# Patient Record
Sex: Female | Born: 1965 | Race: White | Hispanic: No | Smoking: Current every day smoker
Health system: Southern US, Community
[De-identification: ages and names within clinical notes are randomized; demographics above are authoritative.]

## PROBLEM LIST (undated history)

## (undated) DIAGNOSIS — F909 Attention-deficit hyperactivity disorder, unspecified type: Secondary | ICD-10-CM

## (undated) DIAGNOSIS — F39 Unspecified mood [affective] disorder: Secondary | ICD-10-CM

## (undated) DIAGNOSIS — F17210 Nicotine dependence, cigarettes, uncomplicated: Secondary | ICD-10-CM

## (undated) DIAGNOSIS — N951 Menopausal and female climacteric states: Secondary | ICD-10-CM

## (undated) HISTORY — DX: Unspecified mood (affective) disorder: F39

## (undated) HISTORY — DX: Attention-deficit hyperactivity disorder, unspecified type: F90.9

## (undated) HISTORY — DX: Menopausal and female climacteric states: N95.1

## (undated) HISTORY — DX: Nicotine dependence, cigarettes, uncomplicated: F17.210

---

## 1998-06-25 ENCOUNTER — Other Ambulatory Visit: Admission: RE | Admit: 1998-06-25 | Discharge: 1998-06-25 | Payer: Self-pay | Admitting: Obstetrics & Gynecology

## 1998-09-10 ENCOUNTER — Other Ambulatory Visit: Admission: RE | Admit: 1998-09-10 | Discharge: 1998-09-10 | Payer: Self-pay | Admitting: Obstetrics & Gynecology

## 1998-12-27 ENCOUNTER — Other Ambulatory Visit: Admission: RE | Admit: 1998-12-27 | Discharge: 1998-12-27 | Payer: Self-pay | Admitting: Obstetrics & Gynecology

## 1999-03-09 ENCOUNTER — Emergency Department (HOSPITAL_COMMUNITY): Admission: EM | Admit: 1999-03-09 | Discharge: 1999-03-10 | Payer: Self-pay | Admitting: Emergency Medicine

## 1999-04-03 ENCOUNTER — Emergency Department (HOSPITAL_COMMUNITY): Admission: EM | Admit: 1999-04-03 | Discharge: 1999-04-04 | Payer: Self-pay | Admitting: Internal Medicine

## 1999-06-07 ENCOUNTER — Other Ambulatory Visit: Admission: RE | Admit: 1999-06-07 | Discharge: 1999-06-07 | Payer: Self-pay | Admitting: Obstetrics & Gynecology

## 1999-06-11 ENCOUNTER — Inpatient Hospital Stay (HOSPITAL_COMMUNITY): Admission: AD | Admit: 1999-06-11 | Discharge: 1999-06-11 | Payer: Self-pay | Admitting: Obstetrics & Gynecology

## 1999-12-02 ENCOUNTER — Ambulatory Visit (HOSPITAL_COMMUNITY): Admission: RE | Admit: 1999-12-02 | Discharge: 1999-12-02 | Payer: Self-pay | Admitting: Obstetrics & Gynecology

## 2002-01-06 ENCOUNTER — Encounter: Payer: Self-pay | Admitting: Family Medicine

## 2002-01-06 ENCOUNTER — Ambulatory Visit (HOSPITAL_COMMUNITY): Admission: RE | Admit: 2002-01-06 | Discharge: 2002-01-06 | Payer: Self-pay | Admitting: Family Medicine

## 2005-10-30 ENCOUNTER — Encounter: Admission: RE | Admit: 2005-10-30 | Discharge: 2005-10-30 | Payer: Self-pay | Admitting: Specialist

## 2005-12-07 ENCOUNTER — Ambulatory Visit (HOSPITAL_COMMUNITY): Admission: RE | Admit: 2005-12-07 | Discharge: 2005-12-08 | Payer: Self-pay | Admitting: Specialist

## 2009-03-22 ENCOUNTER — Emergency Department (HOSPITAL_BASED_OUTPATIENT_CLINIC_OR_DEPARTMENT_OTHER): Admission: EM | Admit: 2009-03-22 | Discharge: 2009-03-22 | Payer: Self-pay | Admitting: Emergency Medicine

## 2009-03-22 ENCOUNTER — Ambulatory Visit: Payer: Self-pay | Admitting: Radiology

## 2010-08-14 ENCOUNTER — Encounter: Payer: Self-pay | Admitting: Specialist

## 2010-12-09 NOTE — Op Note (Signed)
[facility]  Patient:    Monica Ponce, Monica Ponce                        MRN: [id number] Proc. Date: 12/02/99 Adm. Date:  [id number] Disc. Date: [id number] Attending:  Lars Pinks                           Operative Report  PREOPERATIVE DIAGNOSIS:       Voluntary sterilization.  POSTOPERATIVE DIAGNOSIS:      Voluntary sterilization.  OPERATION:                    Laparoscopic bilateral tubal cautery for sterilization.  SURGEON:                      Richard D. Arlyce Dice, M.D.  ASSISTANT:  ANESTHESIA:                   General endotracheal anesthesia.  ESTIMATED BLOOD LOSS:         10 cc.  FINDINGS:                     Normal appearing tubes, ovaries, and uterus.  No evidence of endometriosis or adhesions.  INDICATIONS:                  This is a 45 year old, gravida 1, para 1, who requested permanent sterilization.  Although, the patient has only one child, she has two step children in her present marriage and desires no further pregnancies. The risk of failure of 2 to 5 per 1000, the fact that the procedure was permanent, and alternative birth control methods which were reversible were all discussed ith her prior to proceeding.  DESCRIPTION OF PROCEDURE:     The patient was taken to the operating room and placed in the supine position and general endotracheal anesthesia was induced. She was then placed in the dorsal lithotomy position and the abdomen, vagina, and perineum were prepped and draped in a sterile fashion.  The bladder was catheterized.  A Hulka tenaculum was placed through the endocervical canal and affixed to the anterior lip of the cervix.  The cervix and uterus were elevated and the Veress needle was introduced into the cul-de-sac.  A pneumoperitoneum was created.  The surgeon regowned and regloved.  An incision was made at the base f the umbilicus.  The laparoscopic trocar was introduced into the pneumoperitoneum. Under  direct visualization an accessory instrument was placed through the suprapubic stab wound.  The pelvis was viewed with the findings noted above. The left fallopian tube was grasped at the isthmic ampullary junction and cauterized along a 3 to 4 cm length leaving 1 cm of normal appearing tube proximal to the urn site.  An identical procedure was then carried out on the contralateral tube. he procedure was then terminated.  The gas was allowed to escape.  The umbilical incision was closed with a subcuticular 4-0 Dexon suture and the suprapubic incision was closed with Steri-Strips.  The patient tolerated the procedure well and left the operating room in good condition. DD:  12/05/99 TD:  12/05/99 Job: [id number]

## 2010-12-09 NOTE — Op Note (Signed)
NAME:  Monica Ponce, Monica Ponce NO.:  [id number]   MEDICAL RECORD NO.:  [id number]          PATIENT TYPE:  AMB   LOCATION:  DAY                          FACILITY:  [facility]   PHYSICIAN:  Jene Every, M.D.    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   OPERATION/PROCEDURE:  1.  Lateral recess decompression.  2.  Foraminotomy L5-S1  3.  Hemilaminotomy L5-S1.   SURGEON:  Jene Every, M.D.   ANESTHESIA:  General.   ASSISTANT:  Roma Schanz, P.A.-C.  with use of operating microscope.   BRIEF HISTORY AND INDICATIONS:  This is a 45 year old female with L5-S1  radiculopathy found to be secondary to lateral recess stenosis and foraminal  disk protrusion only diagnosed by CT myelogram.  MRI showed disk  degeneration which was fairly significant on May 1, but she had minimal back  pain, predominantly buttock and leg pain at the S1 nerve redistribution.  Myelogram indicated significant lateral recess stenosis. Operative  intervention was indicated for decompressing the S1 nerve root by lateral  recess decompression, foraminotomy of L5, evaluation of the disk extending  into the foramen.  The risks and benefits were discussed including bleeding,  infection, anesthetic risks, CSF leakage, epidural fibrosis, __________  disease, need for fusion in the future, anesthetic complications, etc.   DESCRIPTION OF PROCEDURE:  With the patient in the supine position and after  induction of adequate general anesthesia and 1 g of Kefzol, she was placed  prone on the [place] frame.  All bony prominences were well padded.  The  lumbar region was prepped and draped in the usual sterile fashion.  An Tuohy  18-gauge spinal  needle was utilized to localize the L5-S1 interspace  confirmed with x-ray.  Incision was made from the spinous process of L5 to  S1.  Subcutaneous tissue was dissected using electrocautery to achieve  hemostasis.  Dorsal lumbar fascia was identified and divided in  line with  the skin incision.  Paraspinous muscle elevated from the lamina of L5-S1.  McCullough retractors were placed and operating microscope was draped and  brought into the surgical field.  After an x-ray was obtained with Penfield  4 in the interlaminar space confirming the x-ray.  Hemilaminotomy the caudad  edge L5 was performed with  a 2 mm Kerrison, detaching a ligamentum flavum  from the cephalad edge it was also detached from the cephalad edge of S1 and  a foraminotomy of S1 was performed with a 2 mm and 3 mm Kerrison.  Ligamentum flavum removed from the interspace.  There was severe lateral  recessed stenosis noted in comparison with the S1 nerve root secondary to  the facet ligamentum flavum hypertrophy.  This was decompressed out to the  medial border of the pedicle with a 2 mm Kerrison.  This decompressed the  S1  nerve root which was erythematous and edematous.  Then more proximally  performed a foraminotomy of L5.  It was stenotic.  Evaluated the disk.  It  was a hard disk at L5-S1, not a soft disk.  A foraminotomy was completed and  hockey stick probe then placed freely on the L5 foramen.  There was good  excursion of the L5-S1 nerve root, at least 1 cm at the mid pedicle without  difficulty.  I felt this adequately decompressed the L5-S1 nerve roots.  Wound was copiously irrigated with antibiotic irrigation.  Inspection  revealed no CSF leak or active bleeding.  Thrombin-soaked Gelfoam was placed  in the laminotomy defect.  McCullough retractor was removed.  Paraspinous  muscles inspected and there was no active bleeding.  Dorsal lumbar fascia  was reapproximated with #1 Vicryl interrupted figure-of-eight  sutures,  subcutaneous tissue reapproximated with 2-0 Vicryl interrupted sutures.  Skin reapproximated with 4-0 subcuticular Prolene.  Wound reinforced with  Steri-Strips, sterile dressing applied.  The patient was placed supine on  the hospital bed, extubated without difficulty and transported to the  recovery room in satisfactory condition.  The patient tolerated the  procedure well.  There were no complications.      Jene Every, M.D.  Electronically Signed     JB/MEDQ  D:  12/07/2005  T:  12/08/2005  Job:  [id number]

## 2018-09-16 ENCOUNTER — Other Ambulatory Visit: Payer: Self-pay

## 2018-09-16 ENCOUNTER — Ambulatory Visit: Payer: Self-pay | Admitting: Family Medicine

## 2018-09-16 ENCOUNTER — Encounter: Payer: Self-pay | Admitting: Family Medicine

## 2018-09-16 VITALS — BP 124/84 | HR 69 | Temp 98.2°F | Resp 16 | Ht 62.0 in | Wt 168.8 lb

## 2018-09-16 DIAGNOSIS — F17210 Nicotine dependence, cigarettes, uncomplicated: Secondary | ICD-10-CM

## 2018-09-16 DIAGNOSIS — F39 Unspecified mood [affective] disorder: Secondary | ICD-10-CM

## 2018-09-16 DIAGNOSIS — R030 Elevated blood-pressure reading, without diagnosis of hypertension: Secondary | ICD-10-CM

## 2018-09-16 DIAGNOSIS — Z1239 Encounter for other screening for malignant neoplasm of breast: Secondary | ICD-10-CM

## 2018-09-16 DIAGNOSIS — F909 Attention-deficit hyperactivity disorder, unspecified type: Secondary | ICD-10-CM

## 2018-09-16 DIAGNOSIS — F902 Attention-deficit hyperactivity disorder, combined type: Secondary | ICD-10-CM

## 2018-09-16 DIAGNOSIS — N951 Menopausal and female climacteric states: Secondary | ICD-10-CM

## 2018-09-16 DIAGNOSIS — F4321 Adjustment disorder with depressed mood: Secondary | ICD-10-CM | POA: Insufficient documentation

## 2018-09-16 DIAGNOSIS — F5101 Primary insomnia: Secondary | ICD-10-CM | POA: Insufficient documentation

## 2018-09-16 HISTORY — DX: Nicotine dependence, cigarettes, uncomplicated: F17.210

## 2018-09-16 HISTORY — DX: Attention-deficit hyperactivity disorder, unspecified type: F90.9

## 2018-09-16 HISTORY — DX: Menopausal and female climacteric states: N95.1

## 2018-09-16 HISTORY — DX: Unspecified mood (affective) disorder: F39

## 2018-09-16 NOTE — Patient Instructions (Addendum)
Please return for your annual complete physical with pap smear; please come fasting.  We will call you with information regarding your referral appointment. Mammogram  If you do not hear from [place] within the next 2 weeks, please let me know. It can take 1-2 weeks to get appointments set up with the specialists.    It was a pleasure meeting you today! Thank you for choosing [place] to meet your healthcare needs! I truly look forward to working with you. If you have any questions or concerns, please send me a message via Mychart or call the office at [phone].   Steps to Quit Smoking  Smoking tobacco can be harmful to your health and can affect almost every organ in your body. Smoking puts you, and those around you, at risk for developing many serious chronic diseases. Quitting smoking is difficult, but it is one of the best things that you can do for your health. It is never too late to quit. What are the benefits of quitting smoking? When you quit smoking, you lower your risk of developing serious diseases and conditions, such as:  Lung cancer or lung disease, such as COPD.  Heart disease.  Stroke.  Heart attack.  Infertility.  Osteoporosis and bone fractures. Additionally, symptoms such as coughing, wheezing, and shortness of breath may get better when you quit. You may also find that you get sick less often because your body is stronger at fighting off colds and infections. If you are pregnant, quitting smoking can help to reduce your chances of having a baby of low birth weight. How do I get ready to quit? When you decide to quit smoking, create a plan to make sure that you are successful. Before you quit:  Pick a date to quit. Set a date within the next two weeks to give you time to prepare.  Write down the reasons why you are quitting. Keep this list in places where you will see it often, such as on your bathroom mirror or in your car or wallet.  Identify the people, places,  things, and activities that make you want to smoke (triggers) and avoid them. Make sure to take these actions: ? Throw away all cigarettes at home, at work, and in your car. ? Throw away smoking accessories, such as Set designer. ? Clean your car and make sure to empty the ashtray. ? Clean your home, including curtains and carpets.  Tell your family, friends, and coworkers that you are quitting. Support from your loved ones can make quitting easier.  Talk with your health care provider about your options for quitting smoking.  Find out what treatment options are covered by your health insurance. What strategies can I use to quit smoking? Talk with your healthcare provider about different strategies to quit smoking. Some strategies include:  Quitting smoking altogether instead of gradually lessening how much you smoke over a period of time. Research shows that quitting "cold [place]" is more successful than gradually quitting.  Attending in-person counseling to help you build problem-solving skills. You are more likely to have success in quitting if you attend several counseling sessions. Even short sessions of 10 minutes can be effective.  Finding resources and support systems that can help you to quit smoking and remain smoke-free after you quit. These resources are most helpful when you use them often. They can include: ? Online chats with a Veterinary surgeon. ? Telephone quitlines. ? Automotive engineer. ? Support groups or group counseling. ? Text  messaging programs. ? Mobile phone applications.  Taking medicines to help you quit smoking. (If you are pregnant or breastfeeding, talk with your health care provider first.) Some medicines contain nicotine and some do not. Both types of medicines help with cravings, but the medicines that include nicotine help to relieve withdrawal symptoms. Your health care provider may recommend: ? Nicotine patches, gum, or lozenges. ? Nicotine  inhalers or sprays. ? Non-nicotine medicine that is taken by mouth. Talk with your health care provider about combining strategies, such as taking medicines while you are also receiving in-person counseling. Using these two strategies together makes you more likely to succeed in quitting than if you used either strategy on its own. If you are pregnant or breastfeeding, talk with your health care provider about finding counseling or other support strategies to quit smoking. Do not take medicine to help you quit smoking unless told to do so by your health care provider. What things can I do to make it easier to quit? Quitting smoking might feel overwhelming at first, but there is a lot that you can do to make it easier. Take these important actions:  Reach out to your family and friends and ask that they support and encourage you during this time. Call telephone quitlines, reach out to support groups, or work with a counselor for support.  Ask people who smoke to avoid smoking around you.  Avoid places that trigger you to smoke, such as bars, parties, or smoke-break areas at work.  Spend time around people who do not smoke.  Lessen stress in your life, because stress can be a smoking trigger for some people. To lessen stress, try: ? Exercising regularly. ? Deep-breathing exercises. ? Yoga. ? Meditating. ? Performing a body scan. This involves closing your eyes, scanning your body from head to toe, and noticing which parts of your body are particularly tense. Purposefully relax the muscles in those areas.  Download or purchase mobile phone or tablet apps (applications) that can help you stick to your quit plan by providing reminders, tips, and encouragement. There are many free apps, such as QuitGuide from the [employer] developer for Disease Control and Prevention). You can find other support for quitting smoking (smoking cessation) through smokefree.gov and other websites. How will I feel when I quit  smoking? Within the first 24 hours of quitting smoking, you may start to feel some withdrawal symptoms. These symptoms are usually most noticeable 2-3 days after quitting, but they usually do not last beyond 2-3 weeks. Changes or symptoms that you might experience include:  Mood swings.  Restlessness, anxiety, or irritation.  Difficulty concentrating.  Dizziness.  Strong cravings for sugary foods in addition to nicotine.  Mild weight gain.  Constipation.  Nausea.  Coughing or a sore throat.  Changes in how your medicines work in your body.  A depressed mood.  Difficulty sleeping (insomnia). After the first 2-3 weeks of quitting, you may start to notice more positive results, such as:  Improved sense of smell and taste.  Decreased coughing and sore throat.  Slower heart rate.  Lower blood pressure.  Clearer skin.  The ability to breathe more easily.  Fewer sick days. Quitting smoking is very challenging for most people. Do not get discouraged if you are not successful the first time. Some people need to make many attempts to quit before they achieve long-term success. Do your best to stick to your quit plan, and talk with your health care provider if you have  any questions or concerns. This information is not intended to replace advice given to you by your health care provider. Make sure you discuss any questions you have with your health care provider. Document Released: 07/04/2001 Document Revised: 02/13/2017 Document Reviewed: 11/24/2014 Elsevier Interactive Patient Education  2019 [employer].

## 2018-09-16 NOTE — Progress Notes (Signed)
Subjective  CC:  Chief Complaint  Patient presents with  . Establish Care    No PCP in 6 years  . Hypertension    148/102 last week, took 2 of her boyfriends Lisinopril and recent reading was 121/75     HPI: Monica Ponce is a 53 y.o. female who presents to [facility] today to establish care with me as a new patient.   She has the following concerns or needs:  53 year old female who, as noted above, he has not had regular primary care doctor for many years.  She is overdue for most cancer screening test, blood work and physical.  She is ready and willing to make a scheduled appointment for that.  She is a history of mood disorder that is managed by psychiatry.  She is on Lamictal and Klonopin.  She reports that her mood has been difficult mainly related to mood swings.  She does not endorse the diagnosis of bipolar disorder.  She reports her mood has been a bit more variable over the last month or 2.  Her father did pass away in December after an extended and prolonged illness.  She is taking this hard.  She also is having mildly irregular menstrual cycles over the last several months and thinks she could be going through the change.  She endorses mild hot flashes and worsening sleep although primary insomnia has been a problem for her for many years.  She takes medicines for ADHD managed by psychiatry.  Chronic smoker, trying to quit.  Denies symptoms or diagnosis of COPD.  Has had a difficult relationship with 1 of her daughters who has struggled with substance abuse.  She reports a story where she has had a stressful time interacting with her, went to [facility] and checked her blood pressure because she felt flushed and it was very elevated.  As noted above she took a lisinopril that was instructed for her to take by a pharmacist.  Since her blood pressure has been checked multiple times and has been normal.  No history of hypertension.  No chest pain or  palpitation.  No lower extremity edema.  Assessment  1. Elevated blood pressure reading without diagnosis of hypertension   2. Attention deficit hyperactivity disorder (ADHD), combined type   3. Mood disorder (HCC)   4. Perimenopausal   5. Primary insomnia   6. Nicotine dependence, cigarettes, uncomplicated   7. Breast cancer screening   8. Grief reaction      Plan   History of elevated blood pressure that may have been due to stress or emotional upheaval.  Currently normal.  Will monitor closely.  ADD and mood disorder managed by psychiatry.  Recommend follow-up with psychiatry if she feels like her mood is worsening.  Perimenopausal symptoms: Counseling and education given on possible effects from decreasing estrogen including poor sleep, mood changes, hot flashes, vaginal dryness.  Patient will monitor symptoms and follow-up here if symptoms are worsening.  Smoker: Counseling done for cessation.  Referred after visit summary.  Health maintenance: Return for complete physical.  Mammogram ordered today to be done at the breast center.  Patient declines flu shot.  She will return fasting  Follow up:  Return for complete physical. Orders Placed This Encounter  Procedures  . MM DIGITAL SCREENING BILATERAL   No orders of the defined types were placed in this encounter.    Depression screen [facility] 2/9 09/16/2018 09/16/2018  Decreased Interest 1 -  Down, Depressed, Hopeless 1 2  PHQ - 2 Score 2 2  Altered sleeping 1 -  Tired, decreased energy 1 -  Change in appetite 1 -  Feeling bad or failure about yourself  1 -  Trouble concentrating 0 -  Moving slowly or fidgety/restless 1 -  Suicidal thoughts 0 -  PHQ-9 Score 7 -  Difficult doing work/chores Somewhat difficult -    We updated and reviewed the patient's past history in detail and it is documented below.  Patient Active Problem List   Diagnosis Date Noted  . ADHD (attention deficit hyperactivity disorder) 09/16/2018  .  Mood disorder (HCC) 09/16/2018    Managed by Dr. Milagros Evener   . Perimenopausal 09/16/2018  . Primary insomnia 09/16/2018    Failed ambien (ineffective) and trazodone   . Nicotine dependence, cigarettes, uncomplicated 09/16/2018  . Grief reaction 09/16/2018   Health Maintenance  Topic Date Due  . HIV Screening  02/27/1982  . MAMMOGRAM  02/27/1985  . PAP SMEAR-Modifier  02/28/1988  . COLONOSCOPY  02/27/2017  . INFLUENZA VACCINE  10/22/2018 (Originally 02/21/2018)  . TETANUS/TDAP  03/16/2028   Immunization History  Administered Date(s) Administered  . Td 03/16/2018   Current Meds  Medication Sig  . amphetamine-dextroamphetamine (ADDERALL) 20 MG tablet Take 20 mg by mouth QID.  Marland Kitchen clonazePAM (KLONOPIN) 1 MG tablet Take 1 mg by mouth QID.  Marland Kitchen lamoTRIgine (LAMICTAL) 200 MG tablet Take 200 mg by mouth 2 (two) times daily.    Allergies: Patient is allergic to penicillins. Past Medical History Patient  has a past medical history of ADHD (attention deficit hyperactivity disorder) (09/16/2018), Mood disorder (HCC) (09/16/2018), Nicotine dependence, cigarettes, uncomplicated (09/16/2018), and Perimenopausal (09/16/2018). Past Surgical History Patient  has no past surgical history on file. Family History: Patient family history includes Cerebral aneurysm in her mother; Healthy in her sister and son; Heart disease in her father; Hypertension in her mother; Stroke in her father. Social History:  Patient  reports that she has been smoking cigarettes. She has a 18.75 pack-year smoking history. She has never used smokeless tobacco. She reports previous alcohol use. She reports that she does not use drugs.  Review of Systems: Constitutional: negative for fever or malaise Ophthalmic: negative for photophobia, double vision or loss of vision Cardiovascular: negative for chest pain, dyspnea on exertion, or new LE swelling Respiratory: negative for SOB or persistent cough Gastrointestinal: negative  for abdominal pain, change in bowel habits or melena Genitourinary: negative for dysuria or gross hematuria Musculoskeletal: negative for new gait disturbance or muscular weakness Integumentary: negative for new or persistent rashes Neurological: negative for TIA or stroke symptoms Psychiatric: negative for SI or delusions Allergic/Immunologic: negative for hives  Patient Care Team    Relationship Specialty Notifications Start End  Willow Ora, MD PCP - General Family Medicine  09/16/18   Milagros Evener, MD Consulting Physician Psychiatry  09/16/18     Objective  Vitals: BP 124/84   Pulse 69   Temp 98.2 F (36.8 C) (Oral)   Resp 16   Ht  (1.575 m)   Wt 168 lb 12.8 oz (76.6 kg)   LMP 07/16/2018   SpO2 96%   BMI 30.87 kg/m  General:  Well developed, well nourished, no acute distress  Psych:  Alert and oriented,normal mood and affect HEENT:  Normocephalic, atraumatic, non-icteric sclera, PERRL, oropharynx is without mass or exudate, supple neck without adenopathy, mass or thyromegaly Cardiovascular:  RRR without gallop, rub or murmur, nondisplaced PMI  Respiratory:  Good breath sounds bilaterally, CTAB with normal respiratory effort MSK: no deformities, contusions. Joints are without erythema or swelling Skin:  Warm, no rashes or suspicious lesions noted Neurologic:    Mental status is normal.  No tremor.  Normal gait   Commons side effects, risks, benefits, and alternatives for medications and treatment plan prescribed today were discussed, and the patient expressed understanding of the given instructions. Patient is instructed to call or message via MyChart if he/she has any questions or concerns regarding our treatment plan. No barriers to understanding were identified. We discussed Red Flag symptoms and signs in detail. Patient expressed understanding regarding what to do in case of urgent or emergency type symptoms.   Medication list was reconciled, printed and provided  to the patient in AVS. Patient instructions and summary information was reviewed with the patient as documented in the AVS. This note was prepared with assistance of Dragon voice recognition software. Occasional wrong-word or sound-a-like substitutions may have occurred due to the inherent limitations of voice recognition software

## 2018-11-07 ENCOUNTER — Encounter: Payer: Self-pay | Admitting: Family Medicine

## 2018-11-19 ENCOUNTER — Telehealth: Payer: Self-pay | Admitting: Family Medicine

## 2018-11-19 NOTE — Telephone Encounter (Signed)
Pt. Reports she is having to clear her throat more than usual. Feels flushed, but does not have a thermometer. No shortness of breath. Coughs " a little, but I smoke."  Feels good. 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 Can not have a visit at this time because she does not have insurance. Asked about testing for COVID 19. Informed the practices are not testing at this time. States she will find a thermometer and monitor herself.

## 2019-02-25 ENCOUNTER — Ambulatory Visit: Payer: Self-pay | Admitting: *Deleted

## 2019-02-25 ENCOUNTER — Emergency Department (HOSPITAL_COMMUNITY)
Admission: EM | Admit: 2019-02-25 | Discharge: 2019-02-25 | Disposition: A | Discharge status: Home / Self Care | Payer: Self-pay | Attending: Emergency Medicine | Admitting: Emergency Medicine

## 2019-02-25 ENCOUNTER — Encounter (HOSPITAL_COMMUNITY): Payer: Self-pay | Admitting: Emergency Medicine

## 2019-02-25 ENCOUNTER — Emergency Department (HOSPITAL_COMMUNITY): Payer: Self-pay

## 2019-02-25 ENCOUNTER — Other Ambulatory Visit: Payer: Self-pay

## 2019-02-25 DIAGNOSIS — F909 Attention-deficit hyperactivity disorder, unspecified type: Secondary | ICD-10-CM | POA: Insufficient documentation

## 2019-02-25 DIAGNOSIS — F17210 Nicotine dependence, cigarettes, uncomplicated: Secondary | ICD-10-CM | POA: Insufficient documentation

## 2019-02-25 DIAGNOSIS — R0602 Shortness of breath: Secondary | ICD-10-CM | POA: Insufficient documentation

## 2019-02-25 DIAGNOSIS — R531 Weakness: Secondary | ICD-10-CM | POA: Insufficient documentation

## 2019-02-25 DIAGNOSIS — Z79899 Other long term (current) drug therapy: Secondary | ICD-10-CM | POA: Insufficient documentation

## 2019-02-25 DIAGNOSIS — H539 Unspecified visual disturbance: Secondary | ICD-10-CM | POA: Insufficient documentation

## 2019-02-25 LAB — DIFFERENTIAL
Abs Immature Granulocytes: 0.04 10*3/uL (ref 0.00–0.07)
Basophils Absolute: 0.1 10*3/uL (ref 0.0–0.1)
Basophils Relative: 1 %
Eosinophils Absolute: 0.3 10*3/uL (ref 0.0–0.5)
Eosinophils Relative: 3 %
Immature Granulocytes: 0 %
Lymphocytes Relative: 36 %
Lymphs Abs: 3.8 10*3/uL (ref 0.7–4.0)
Monocytes Absolute: 0.7 10*3/uL (ref 0.1–1.0)
Monocytes Relative: 6 %
Neutro Abs: 5.6 10*3/uL (ref 1.7–7.7)
Neutrophils Relative %: 54 %

## 2019-02-25 LAB — CBC
HCT: 43.9 % (ref 36.0–46.0)
Hemoglobin: 14.1 g/dL (ref 12.0–15.0)
MCH: 31.5 pg (ref 26.0–34.0)
MCHC: 32.1 g/dL (ref 30.0–36.0)
MCV: 98 fL (ref 80.0–100.0)
Platelets: 272 10*3/uL (ref 150–400)
RBC: 4.48 MIL/uL (ref 3.87–5.11)
RDW: 12.2 % (ref 11.5–15.5)
WBC: 10.4 10*3/uL (ref 4.0–10.5)
nRBC: 0 % (ref 0.0–0.2)

## 2019-02-25 LAB — COMPREHENSIVE METABOLIC PANEL
ALT: 17 U/L (ref 0–44)
AST: 20 U/L (ref 15–41)
Albumin: 4 g/dL (ref 3.5–5.0)
Alkaline Phosphatase: 83 U/L (ref 38–126)
Anion gap: 6 (ref 5–15)
BUN: 21 mg/dL — ABNORMAL HIGH (ref 6–20)
CO2: 27 mmol/L (ref 22–32)
Calcium: 9.8 mg/dL (ref 8.9–10.3)
Chloride: 106 mmol/L (ref 98–111)
Creatinine, Ser: 1.08 mg/dL — ABNORMAL HIGH (ref 0.44–1.00)
GFR calc Af Amer: >60 mL/min (ref >60)
GFR calc non Af Amer: 59 mL/min — ABNORMAL LOW (ref >60)
Glucose, Bld: 92 mg/dL (ref 70–99)
Potassium: 5.5 mmol/L — ABNORMAL HIGH (ref 3.5–5.1)
Sodium: 139 mmol/L (ref 135–145)
Total Bilirubin: 0.2 mg/dL — ABNORMAL LOW (ref 0.3–1.2)
Total Protein: 6.5 g/dL (ref 6.5–8.1)

## 2019-02-25 LAB — APTT: aPTT: 27 seconds (ref 24–36)

## 2019-02-25 LAB — PROTIME-INR
INR: 1 (ref 0.8–1.2)
Prothrombin Time: 12.8 seconds (ref 11.4–15.2)

## 2019-02-25 LAB — CBG MONITORING, ED: Glucose-Capillary: 77 mg/dL (ref 70–99)

## 2019-02-25 LAB — I-STAT CHEM 8, ED
BUN: 27 mg/dL — ABNORMAL HIGH (ref 6–20)
Calcium, Ion: 1.29 mmol/L (ref 1.15–1.40)
Chloride: 104 mmol/L (ref 98–111)
Creatinine, Ser: 1 mg/dL (ref 0.44–1.00)
Glucose, Bld: 79 mg/dL (ref 70–99)
HCT: 43 % (ref 36.0–46.0)
Hemoglobin: 14.6 g/dL (ref 12.0–15.0)
Potassium: 5.5 mmol/L — ABNORMAL HIGH (ref 3.5–5.1)
Sodium: 139 mmol/L (ref 135–145)
TCO2: 27 mmol/L (ref 22–32)

## 2019-02-25 LAB — I-STAT BETA HCG BLOOD, ED (MC, WL, AP ONLY): I-stat hCG, quantitative: <5 m[IU]/mL (ref <5)

## 2019-02-25 MED ORDER — SODIUM CHLORIDE 0.9% FLUSH: 3.0000 mL | Freq: Once | INTRAVENOUS | Status: DC

## 2019-02-25 MED ORDER — LORAZEPAM 1 MG PO TABS
1.0000 mg | ORAL_TABLET | Freq: Once | ORAL | Status: AC
  Administered 2019-02-25: 18:00:00 1 mg via ORAL
  Filled 2019-02-25: qty 1

## 2019-02-25 NOTE — ED Notes (Signed)
Patient transported to MRI 

## 2019-02-25 NOTE — ED Provider Notes (Signed)
[facility]   CSN: [id number] Arrival date & time: 02/25/19  1209    History   Chief Complaint Chief Complaint  Patient presents with  . Dizziness  . Shortness of Breath  . Possible TIA    HPI Monica Ponce is a 53 y.o. female.     HPI Pt states she had sudden onset of dizziness, visual difficulty on Sunday.  Pt states she had to be helped out of the store.  Pt denies any focal arm or leg weakness.  Later in the evening her family thought her speech was slurred. Pt still feels like her vision is off.  Pt still feels like her vision is blurry.  She feels like it is worse in the right eye.  SHe called her doctor today and was told to come to the ED.    Pt states also feels like her face hurts, pt feels like it is more on the left.  No fevers or chills.  Occsnl cough.  No shortness of breath. Past Medical History:  Diagnosis Date  . ADHD (attention deficit hyperactivity disorder) 09/16/2018  . Mood disorder (HCC) 09/16/2018   Managed by Dr. Milagros Evenerupinder Kaur  . Nicotine dependence, cigarettes, uncomplicated 09/16/2018  . Perimenopausal 09/16/2018    Patient Active Problem List   Diagnosis Date Noted  . ADHD (attention deficit hyperactivity disorder) 09/16/2018  . Mood disorder (HCC) 09/16/2018  . Perimenopausal 09/16/2018  . Primary insomnia 09/16/2018  . Nicotine dependence, cigarettes, uncomplicated 09/16/2018  . Grief reaction 09/16/2018    History reviewed. No pertinent surgical history.   OB History   No obstetric history on file.      Home Medications    Prior to Admission medications   Medication Sig Start Date End Date Taking? Authorizing Provider  amphetamine-dextroamphetamine (ADDERALL) 20 MG tablet Take 20 mg by mouth QID.    [provider]  clonazePAM (KLONOPIN) 1 MG tablet Take 1 mg by mouth QID.    [provider]  lamoTRIgine (LAMICTAL) 200 MG tablet Take 200 mg by mouth 2 (two) times  daily.    [provider]    Family History Family History  Problem Relation Age of Onset  . Cerebral aneurysm Mother   . Hypertension Mother   . Heart disease Father   . Stroke Father   . Healthy Sister   . Healthy Son     Social History Social History   Tobacco Use  . Smoking status: Current Every Day Smoker    Packs/day: 0.75    Years: 25.00    Pack years: 18.75    Types: Cigarettes  . Smokeless tobacco: Never Used  Substance Use Topics  . Alcohol use: Not Currently  . Drug use: Never     Allergies   Penicillins   Review of Systems Review of Systems  All other systems reviewed and are negative.    Physical Exam Updated Vital Signs BP 119/80   Pulse 71   Temp (!) 97.4 F (36.3 C)   Resp 14   Ht 1.651 m (5\' 5" )   Wt 65.8 kg   LMP 01/25/2019 (Exact Date)   SpO2 98%   BMI 24.13 kg/m   Physical Exam Vitals signs and nursing note reviewed.  Constitutional:      General: She is not in acute distress.    Appearance: She is well-developed.  HENT:     Head: Normocephalic and atraumatic.     Right Ear:  External ear normal.     Left Ear: External ear normal.  Eyes:     General: No scleral icterus.       Right eye: No discharge.        Left eye: No discharge.     Conjunctiva/sclera: Conjunctivae normal.  Neck:     Musculoskeletal: Neck supple.     Trachea: No tracheal deviation.  Cardiovascular:     Rate and Rhythm: Normal rate and regular rhythm.  Pulmonary:     Effort: Pulmonary effort is normal. No respiratory distress.     Breath sounds: Normal breath sounds. No stridor. No wheezing or rales.  Abdominal:     General: Bowel sounds are normal. There is no distension.     Palpations: Abdomen is soft.     Tenderness: There is no abdominal tenderness. There is no guarding or rebound.  Musculoskeletal:        General: No tenderness.  Skin:    General: Skin is warm and dry.     Findings: No rash.  Neurological:     Mental Status: She  is alert and oriented to person, place, and time.     Cranial Nerves: No cranial nerve deficit (No facial droop, extraocular movements intact, tongue midline ).     Sensory: No sensory deficit.     Motor: No abnormal muscle tone or seizure activity.     Coordination: Coordination normal.     Comments: No pronator drift bilateral upper extrem, able to hold both legs off bed for 5 seconds, sensation intact in all extremities, no visual field cuts, no left or right sided neglect, normal finger-nose exam bilaterally, no nystagmus noted       ED Treatments / Results  Labs (all labs ordered are listed, but only abnormal results are displayed) Labs Reviewed  COMPREHENSIVE METABOLIC PANEL - Abnormal; Notable for the following components:      Result Value   Potassium 5.5 (*)    BUN 21 (*)    Creatinine, Ser 1.08 (*)    Total Bilirubin 0.2 (*)    GFR calc non Af Amer 59 (*)    All other components within normal limits  I-STAT CHEM 8, ED - Abnormal; Notable for the following components:   Potassium 5.5 (*)    BUN 27 (*)    All other components within normal limits  PROTIME-INR  APTT  CBC  DIFFERENTIAL  CBG MONITORING, ED  I-STAT BETA HCG BLOOD, ED ([facility], [facility], [facility] ONLY)    EKG EKG Interpretation  Date/Time:  Tuesday February 25 2019 12:17:39 EDT Ventricular Rate:  70 PR Interval:  164 QRS Duration: 80 QT Interval:  368 QTC Calculation: 397 R Axis:   6 Text Interpretation:  Normal sinus rhythm Low voltage QRS Cannot rule out Anterior infarct , age undetermined Abnormal ECG No old tracing to compare Confirmed by Linwood DibblesKnapp, Anthonymichael Munday [phone]([id number]) on 02/25/2019 5:25:14 PM   Radiology Mr Brain Wo Contrast  Result Date: 02/25/2019 CLINICAL DATA:  Dizziness beginning 3 days ago. EXAM: MRI HEAD WITHOUT CONTRAST TECHNIQUE: Multiplanar, multiecho pulse sequences of the brain and surrounding structures were obtained without intravenous contrast. COMPARISON:  None. FINDINGS: Brain: Diffusion imaging does not  show any acute or subacute infarction. The cerebellum is normal. Small focus of T2 signal in the right pons. Cerebral hemispheres are normal except for a few scattered punctate foci of T2 and FLAIR signal within the white matter. These can be seen in normal individuals or can reflect an early  manifestation of small vessel change. No cortical or large vessel territory infarction. No mass lesion, hemorrhage, hydrocephalus or extra-axial collection. Vascular: Major vessels at the base of the brain show flow. Skull and upper cervical spine: Negative Sinuses/Orbits: Clear/normal Other: None IMPRESSION: No acute finding. Scattered punctate foci of T2 and FLAIR signal within the hemispheric white matter and the right pons most consistent with an early manifestation of small vessel change. Electronically Signed   By: Nelson Chimes M.D.   On: 02/25/2019 20:17    Procedures Procedures (including critical care time)  Medications Ordered in ED Medications  sodium chloride flush (NS) 0.9 % injection 3 mL (3 mLs Intravenous Not Given 02/25/19 2026)  LORazepam (ATIVAN) tablet 1 mg (1 mg Oral Given 02/25/19 1751)     Initial Impression / Assessment and Plan / ED Course  I have reviewed the triage vital signs and the nursing notes.  Pertinent labs & imaging results that were available during my care of the patient were reviewed by me and considered in my medical decision making (see chart for details).   Patient presented to the emergency room for acute onset of visual disturbance and weakness.  Patient was worried she may have had a stroke.  No definite focal deficits noted on exam today.  Questionable visual complaints.  Neurologic exam is reassuring.  Laboratory tests are unremarkable with exception of slight increase in potassium but I suspect this is related to hemolysis and not true value.  She does not have any evidence of renal insufficiency.  Patient did have an MRI in the ED.  No evidence of acute abnormality.   Patient's exam is reassuring at this time.  I think she is stable for outpatient follow-up.  Consider outpatient neurology evaluation.  Final Clinical Impressions(s) / ED Diagnoses   Final diagnoses:  Visual disturbance    ED Discharge Orders    None       Dorie Rank, MD 02/25/19 2032

## 2019-02-25 NOTE — Discharge Instructions (Addendum)
Follow-up with your primary care doctor.  Consider an outpatient neurology referral.  Consider following up with an eye doctor for a complete eye exam

## 2019-02-25 NOTE — Telephone Encounter (Signed)
Patient is calling- she thinks she may have had stroke Sunday- patient had blurred vision- speech slurred- patient had to have family member come and get her from the store. At first she thought it was due to mask- but patient has residual symptoms-patient states she has mouth changes. Patient is having some memory loss- hard to get thoughts out.  Patient states she has had several episodes like this recently- but this is the worst and had residual effects. Advised ED for evaluation.  Reason for Disposition . [1] Weakness (i.e., paralysis, loss of muscle strength) of the face, arm / hand, or leg / foot on one side of the body AND [2] sudden onset AND [3] brief (now gone)  Answer Assessment - Initial Assessment Questions 1. SYMPTOM: "What is the main symptom you are concerned about?" (e.g., weakness, numbness)     Blurred vision, slurred speech 2. ONSET: "When did this start?" (minutes, hours, days; while sleeping)     Sunday 3. LAST NORMAL: "When was the last time you were normal (no symptoms)?"     Saturday 4. PATTERN "Does this come and go, or has it been constant since it started?"  "Is it present now?"     Comes and goes 5. CARDIAC SYMPTOMS: "Have you had any of the following symptoms: chest pain, difficulty breathing, palpitations?"     Patient did have mouth pain 6. NEUROLOGIC SYMPTOMS: "Have you had any of the following symptoms: headache, dizziness, vision loss, double vision, changes in speech, unsteady on your feet?"     Headaches, vision loss, changes in speech, unsteady on feet 7. OTHER SYMPTOMS: "Do you have any other symptoms?"     Residual changes in mouth and eye 8. PREGNANCY: "Is there any chance you are pregnant?" "When was your last menstrual period?"     No- BTL  Protocols used: NEUROLOGIC DEFICIT-A-AH

## 2019-02-25 NOTE — Telephone Encounter (Signed)
FYI.. Called pt to verify if she was going for further evaluation, no answer

## 2019-02-25 NOTE — ED Notes (Signed)
Discharge instructions discussed with pt and pt family at bedside. Pt verbalized understanding of follow up with PCP/neurology. Pt states having no questions at this time. Pt to go home with family.

## 2019-02-25 NOTE — Telephone Encounter (Signed)
See note

## 2019-02-25 NOTE — ED Triage Notes (Signed)
Pt reports dizziness, SOB, and neuro symptoms that started on Sunday. Pt called her PCP who told her to come into the hospital today.

## 2021-02-28 ENCOUNTER — Emergency Department (HOSPITAL_COMMUNITY): Payer: Self-pay

## 2021-02-28 ENCOUNTER — Encounter (HOSPITAL_COMMUNITY): Payer: Self-pay | Admitting: *Deleted

## 2021-02-28 ENCOUNTER — Emergency Department (HOSPITAL_COMMUNITY)
Admission: EM | Admit: 2021-02-28 | Discharge: 2021-02-28 | Disposition: A | Discharge status: Home / Self Care | Payer: Self-pay | Attending: Emergency Medicine | Admitting: Emergency Medicine

## 2021-02-28 DIAGNOSIS — H538 Other visual disturbances: Secondary | ICD-10-CM

## 2021-02-28 DIAGNOSIS — F17210 Nicotine dependence, cigarettes, uncomplicated: Secondary | ICD-10-CM | POA: Insufficient documentation

## 2021-02-28 DIAGNOSIS — I10 Essential (primary) hypertension: Secondary | ICD-10-CM | POA: Insufficient documentation

## 2021-02-28 LAB — COMPREHENSIVE METABOLIC PANEL
ALT: 13 U/L (ref 0–44)
AST: 16 U/L (ref 15–41)
Albumin: 4 g/dL (ref 3.5–5.0)
Alkaline Phosphatase: 78 U/L (ref 38–126)
Anion gap: 11 (ref 5–15)
BUN: 11 mg/dL (ref 6–20)
CO2: 23 mmol/L (ref 22–32)
Calcium: 8.8 mg/dL — ABNORMAL LOW (ref 8.9–10.3)
Chloride: 101 mmol/L (ref 98–111)
Creatinine, Ser: 0.6 mg/dL (ref 0.44–1.00)
GFR, Estimated: >60 mL/min (ref >60)
Glucose, Bld: 94 mg/dL (ref 70–99)
Potassium: 4 mmol/L (ref 3.5–5.1)
Sodium: 135 mmol/L (ref 135–145)
Total Bilirubin: 0.4 mg/dL (ref 0.3–1.2)
Total Protein: 6.8 g/dL (ref 6.5–8.1)

## 2021-02-28 LAB — CBC WITH DIFFERENTIAL/PLATELET
Abs Immature Granulocytes: 0.04 10*3/uL (ref 0.00–0.07)
Basophils Absolute: 0 10*3/uL (ref 0.0–0.1)
Basophils Relative: 1 %
Eosinophils Absolute: 0.2 10*3/uL (ref 0.0–0.5)
Eosinophils Relative: 2 %
HCT: 43.3 % (ref 36.0–46.0)
Hemoglobin: 14.3 g/dL (ref 12.0–15.0)
Immature Granulocytes: 1 %
Lymphocytes Relative: 27 %
Lymphs Abs: 2.4 10*3/uL (ref 0.7–4.0)
MCH: 31.2 pg (ref 26.0–34.0)
MCHC: 33 g/dL (ref 30.0–36.0)
MCV: 94.5 fL (ref 80.0–100.0)
Monocytes Absolute: 0.4 10*3/uL (ref 0.1–1.0)
Monocytes Relative: 5 %
Neutro Abs: 5.7 10*3/uL (ref 1.7–7.7)
Neutrophils Relative %: 64 %
Platelets: 278 10*3/uL (ref 150–400)
RBC: 4.58 MIL/uL (ref 3.87–5.11)
RDW: 12.7 % (ref 11.5–15.5)
WBC: 8.7 10*3/uL (ref 4.0–10.5)
nRBC: 0 % (ref 0.0–0.2)

## 2021-02-28 NOTE — Discharge Instructions (Addendum)
Make an appointment to follow-up with your primary care doctor.  If you do not have 1, please call [facility].  It is imperative that you take blood pressure medication as prescribed, I also recommend buying a home monitor and recording your blood pressures prior to the visit.  If things worsen return back to the ED.

## 2021-02-28 NOTE — ED Provider Notes (Signed)
[facility]-EMERGENCY DEPT Provider Note   CSN: [id number] Arrival date & time: 02/28/21  1356     History Chief Complaint  Patient presents with   Blurred Vision   Hypertension    Monica WALDMAN is a 55 y.o. female.  HPI  Patient presents for blood blurry vision.  She states it started acutely at 11:00 AM and continued until 3 PM.  Denies any alleviating factors, no aggravating factors.  Blurry vision has resolved on its own, but she is worried about her blood pressure.  She denies any chest pain or shortness of breath (she told a different story in triage, but she adamantly denies any chest pain to me).  She has unilateral headache that started 1 hour ago, is constant and is not associated with any nausea or vomiting. tates that she should be on high blood pressure medicine but has not been on it for multiple years.  states the reason she has not been on blood pressure medicine is because she has not had time to follow-up with a primary care doctor for the last few years.    She has had a similar episode 2 years ago for which she was seen in the ED.  She has no history of stroke, MI, blood clots.  She is a daily cigarette smoker.   She does not wear contacts or glasses, does not see an eye doctor.    Past Medical History:  Diagnosis Date   ADHD (attention deficit hyperactivity disorder) 09/16/2018   Mood disorder (HCC) 09/16/2018   Managed by Dr. Milagros Evener   Nicotine dependence, cigarettes, uncomplicated 09/16/2018   Perimenopausal 09/16/2018    Patient Active Problem List   Diagnosis Date Noted   ADHD (attention deficit hyperactivity disorder) 09/16/2018   Mood disorder (HCC) 09/16/2018   Perimenopausal 09/16/2018   Primary insomnia 09/16/2018   Nicotine dependence, cigarettes, uncomplicated 09/16/2018   Grief reaction 09/16/2018    History reviewed. No pertinent surgical history.   OB History   No obstetric history on file.     Family History   Problem Relation Age of Onset   Cerebral aneurysm Mother    Hypertension Mother    Heart disease Father    Stroke Father    Healthy Sister    Healthy Son     Social History   Tobacco Use   Smoking status: Every Day    Packs/day: 0.75    Years: 25.00    Pack years: 18.75    Types: Cigarettes   Smokeless tobacco: Never  Vaping Use   Vaping Use: Some days   Substances: Nicotine  Substance Use Topics   Alcohol use: Not Currently   Drug use: Never    Home Medications Prior to Admission medications   Medication Sig Start Date End Date Taking? Authorizing Provider  amphetamine-dextroamphetamine (ADDERALL) 20 MG tablet Take 20 mg by mouth QID.    [provider]  clonazePAM (KLONOPIN) 1 MG tablet Take 1 mg by mouth QID.    [provider]  lamoTRIgine (LAMICTAL) 200 MG tablet Take 200 mg by mouth 2 (two) times daily.    [provider]    Allergies    Penicillins  Review of Systems   Review of Systems  Constitutional:  Negative for fatigue and fever.  Eyes:  Positive for visual disturbance.  Respiratory:  Negative for chest tightness and shortness of breath.   Cardiovascular:  Negative for chest pain.  Gastrointestinal:  Negative for nausea and  vomiting.  Neurological:  Positive for headaches. Negative for syncope.   Physical Exam Updated Vital Signs BP (!) 174/96   Pulse (!) 58   Temp 98.7 F (37.1 C) (Oral)   Resp 16   SpO2 100%   Physical Exam Vitals and nursing note reviewed. Exam conducted with a chaperone present.  Constitutional:      General: She is not in acute distress.    Appearance: Normal appearance.  HENT:     Head: Normocephalic and atraumatic.  Eyes:     General: No scleral icterus.    Extraocular Movements: Extraocular movements intact.     Pupils: Pupils are equal, round, and reactive to light.  Cardiovascular:     Rate and Rhythm: Normal rate and regular rhythm.  Pulmonary:     Effort: Pulmonary effort is  normal.     Breath sounds: Normal breath sounds.  Skin:    Coloration: Skin is not jaundiced.  Neurological:     Mental Status: She is alert. Mental status is at baseline.     Coordination: Coordination normal.     Comments: Cranial nerves III through XII are grossly intact, good strength is equal bilaterally.  Plantar flexion and dorsiflexion 5/5 against resistance.  DP and PT are 2+ bilaterally    ED Results / Procedures / Treatments   Labs (all labs ordered are listed, but only abnormal results are displayed) Labs Reviewed  COMPREHENSIVE METABOLIC PANEL - Abnormal; Notable for the following components:      Result Value   Calcium 8.8 (*)    All other components within normal limits  CBC WITH DIFFERENTIAL/PLATELET    EKG None  Radiology DG Chest 2 View  Result Date: 02/28/2021 CLINICAL DATA:  Chest pain.  Blurry vision today EXAM: CHEST - 2 VIEW COMPARISON:  None. FINDINGS: The cardiomediastinal contours are normal. The lungs are clear. Pulmonary vasculature is normal. No consolidation, pleural effusion, or pneumothorax. Broad-based scoliotic curvature of the thoracic spine. No acute osseous abnormalities are seen. IMPRESSION: No acute chest findings. Electronically Signed   By: Narda Rutherford M.D.   On: 02/28/2021 15:36    Procedures Procedures   Medications Ordered in ED Medications - No data to display  ED Course  I have reviewed the triage vital signs and the nursing notes.  Pertinent labs & imaging results that were available during my care of the patient were reviewed by me and considered in my medical decision making (see chart for details).    MDM Rules/Calculators/A&P                           Patient is hypertensive, but otherwise her vitals are stable.  She has nontoxic-appearing, physical exam reassuring.  No focal deficits on neuro exam, low suspicion for [facility] but will do a CT scan to reassure her that there is no acute process causing the visual changes  such as an SAH, mass, hemorrhage.  EKG without ST elevation concerning for ACS, CBC without leukocytosis or anemia.  BMP does not show any signs of renal impairment or elevated creatinine concerning for hypertensive emergency.  CT head reassuring, chest x-ray reassuring.  I suspect her symptoms could be related to high blood pressure, could also be related to dehydration or stress.  Discussed with her the needs to follow-up with her primary care doctor for recheck and starting blood pressure medication.  At this point patient is appropriate discharge.  Return precautions given.  Final  Clinical Impression(s) / ED Diagnoses Final diagnoses:  None    Rx / DC Orders ED Discharge Orders     None        Theron Arista, [place] 02/28/21 1912    Margarita Grizzle, MD 03/01/21 [phone]

## 2021-02-28 NOTE — ED Provider Notes (Signed)
Emergency Medicine Provider Triage Evaluation Note  Monica Ponce , a 55 y.o. female  was evaluated in triage.  Pt complains of blurry vision along with elevated high blood pressure over the weekend.  Reports prior history of high blood pressure, states she currently does not take any blood pressure medication.  She also had some substernal chest pain over the weekend as well.  States she called EMS today in order to be seen as she had a blurry vision, stating "I just cannot focus on anything ".  No current PCP, currently on no blood pressure meds.  Review of Systems  Positive: Headache, blurry vision, chest pain Negative:  Shortness of breath, abdominal pain  Physical Exam  BP (!) 158/109 (BP Location: Right Arm)   Pulse 60   Temp 98.7 F (37.1 C) (Oral)   Resp 16   SpO2 98%  Gen:   Awake, no distress   Resp:  Normal effort  MSK:   Moves extremities without difficulty  Other:    Medical Decision Making  Medically screening exam initiated at 2:19 PM.  Appropriate orders placed.  Monica Ponce was informed that the remainder of the evaluation will be completed by another provider, this initial triage assessment does not replace that evaluation, and the importance of remaining in the ED until their evaluation is complete.  Patient here for elevated blood pressure along with blurry vision, similar episode in the past which resolved.  No prior history of CVA.  Currently not taking any medication for blood pressure control, during our evaluation blood pressure is 158/109, heart rate is 60.  Labs have been ordered.  Denies any trauma.   Claude Manges, PA-C 02/28/21 1422    Sloan Leiter, DO 02/28/21 1746

## 2021-02-28 NOTE — ED Triage Notes (Signed)
Per EMS, pt reports blurred vision while she was sitting her car today and was unable to drive home. She has hx of hypertension but is not on medication. BP ranged 171-212 systolic. CBG 101. HR 62.

## 2022-03-02 IMAGING — CT CT HEAD W/O CM
3 series · 14 of 47 positions shown, 16 images · non-contrast
Comparison: MRI 02/25/2019

CLINICAL DATA: Headache, intracranial hemorrhage suspected

EXAM:
CT HEAD WITHOUT CONTRAST
TECHNIQUE: Contiguous axial images were obtained from the base of the skull
through the vertex without intravenous contrast.

[Series 2: head wo · axial · 0.47mm/px · z∈[-169,-44]mm · 8 of 30 slices shown, 10 images]
[im 3/30  brain]
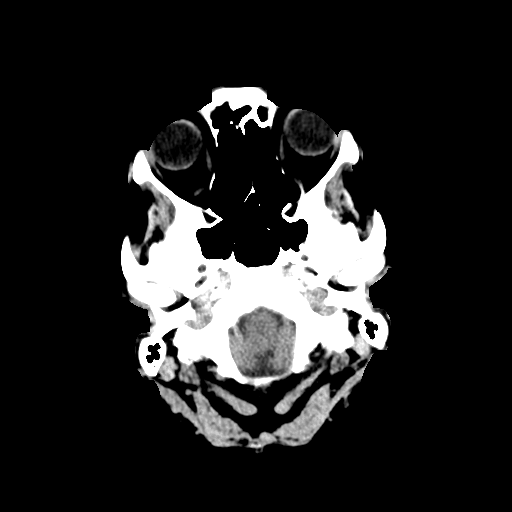
[im 3/30  bone]
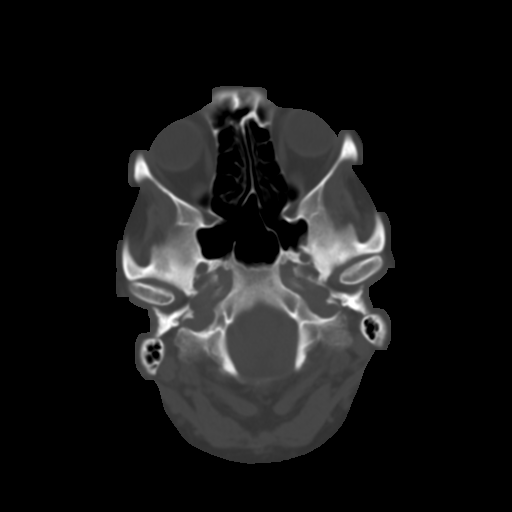
[im 7/30  brain]
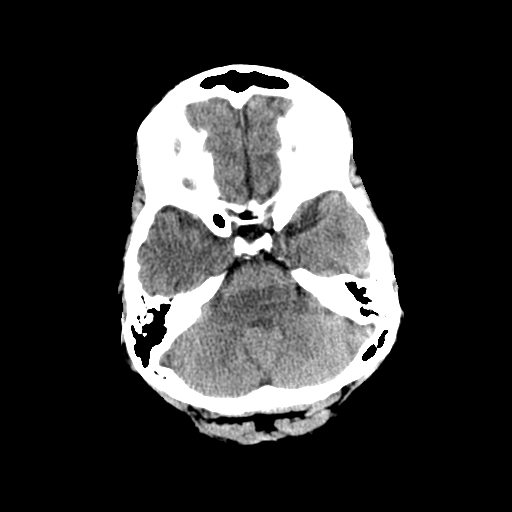
[im 10/30  brain]
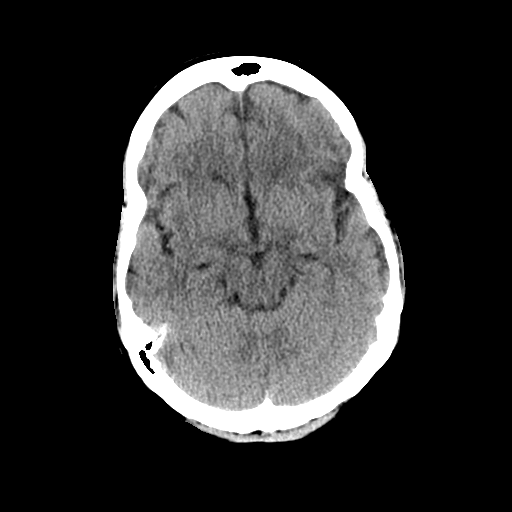
[im 14/30  brain]
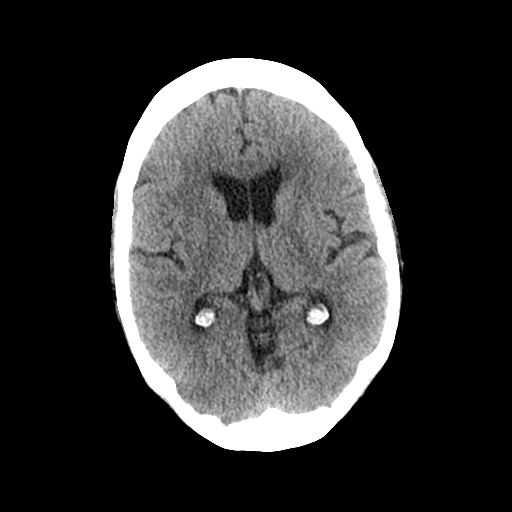
[im 17/30  brain]
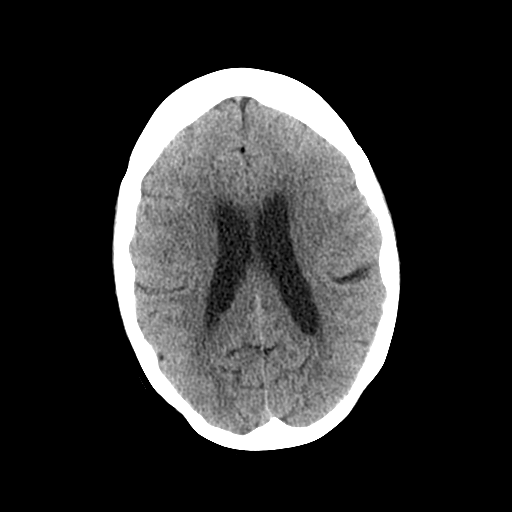
[im 17/30  bone]
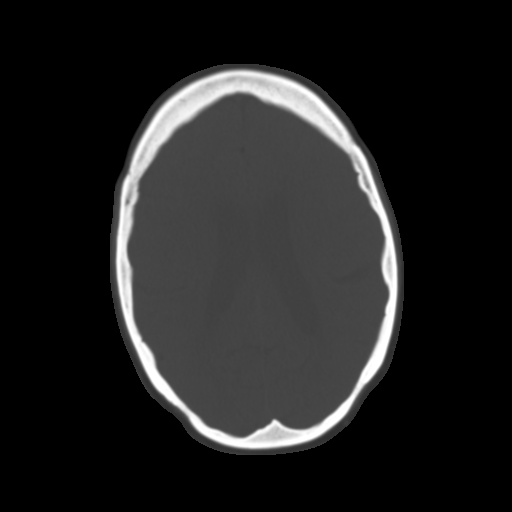
[im 21/30  brain]
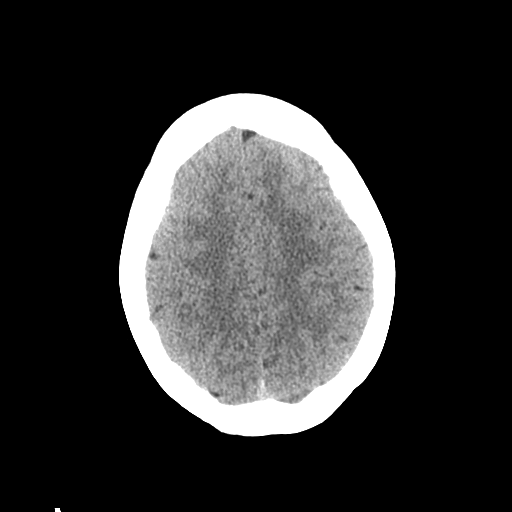
[im 24/30  brain]
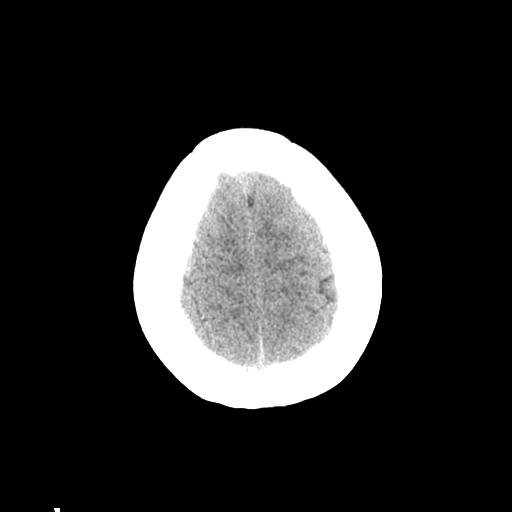
[im 28/30  brain]
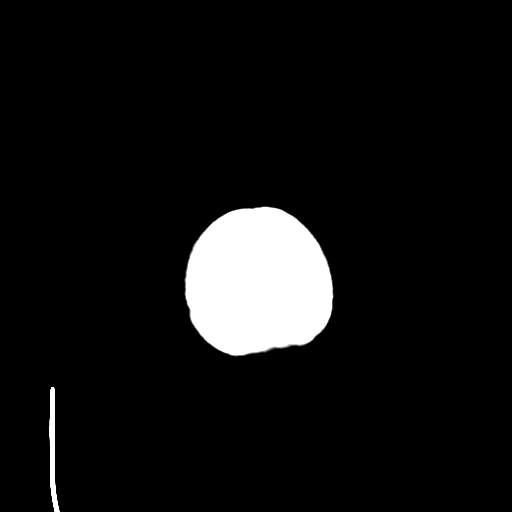

[Series 4: coronal soft tissue · coronal · 0.32mm/px · 3 of 63 slices shown]
[im 21/63  brain]
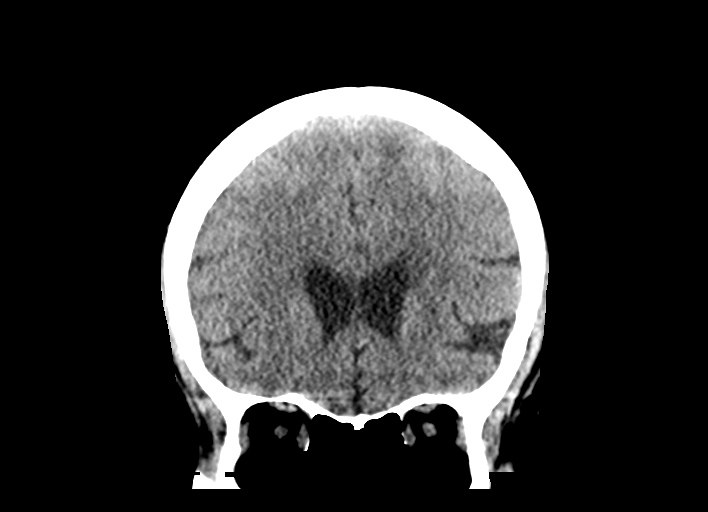
[im 28/63  brain]
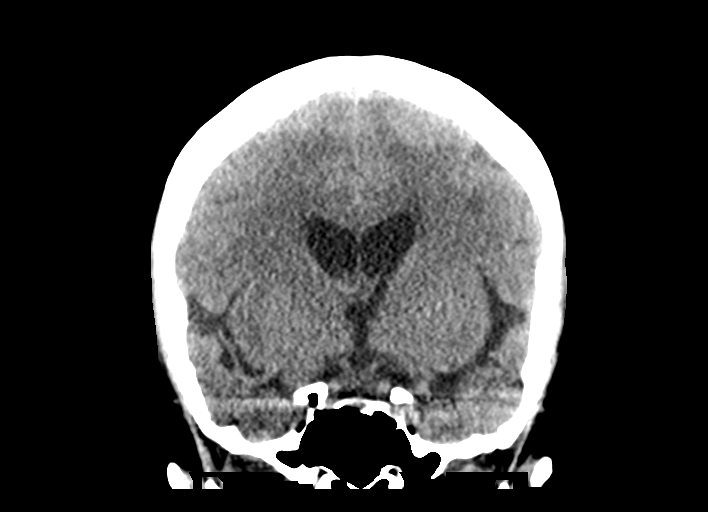
[im 35/63  brain]
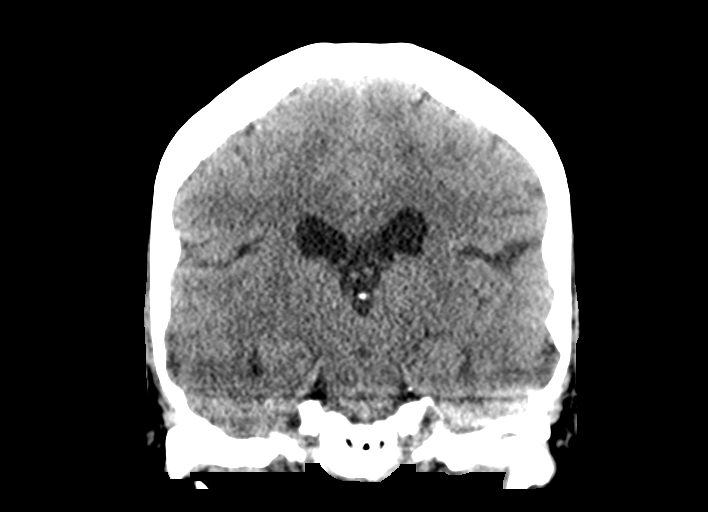

[Series 5: sagittal soft tissue · sagittal · 0.31mm/px · 3 of 48 slices shown]
[im 16/48  brain]
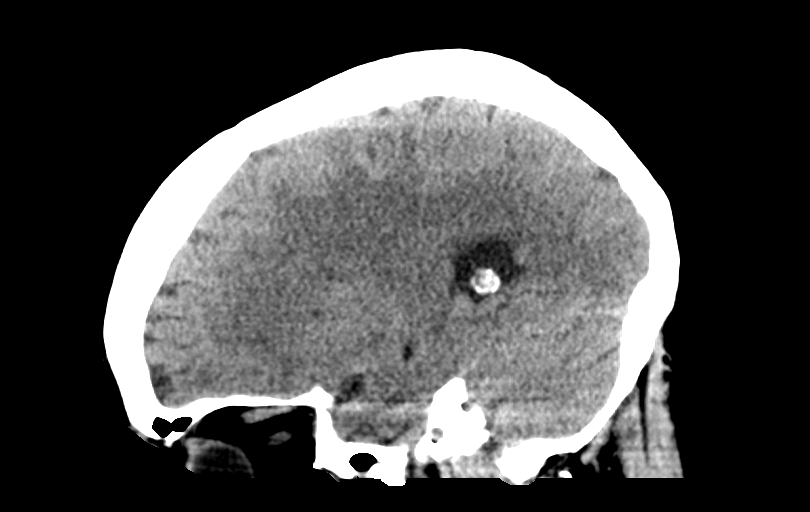
[im 24/48  brain]
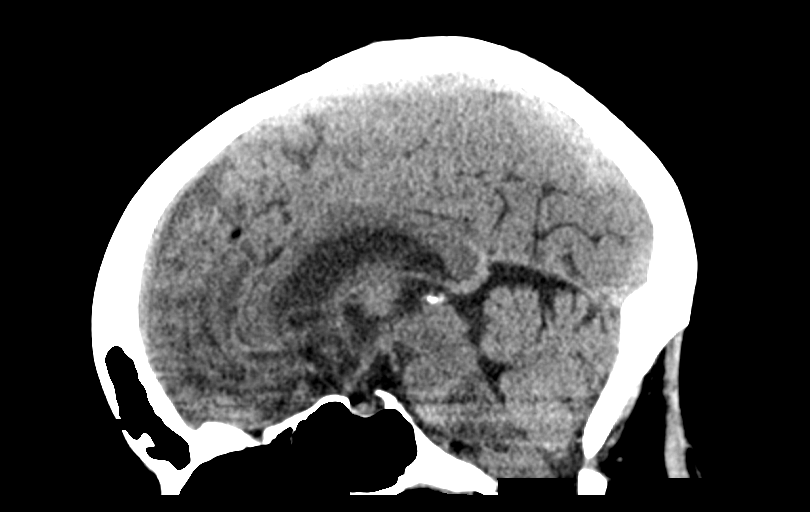
[im 32/48  brain]
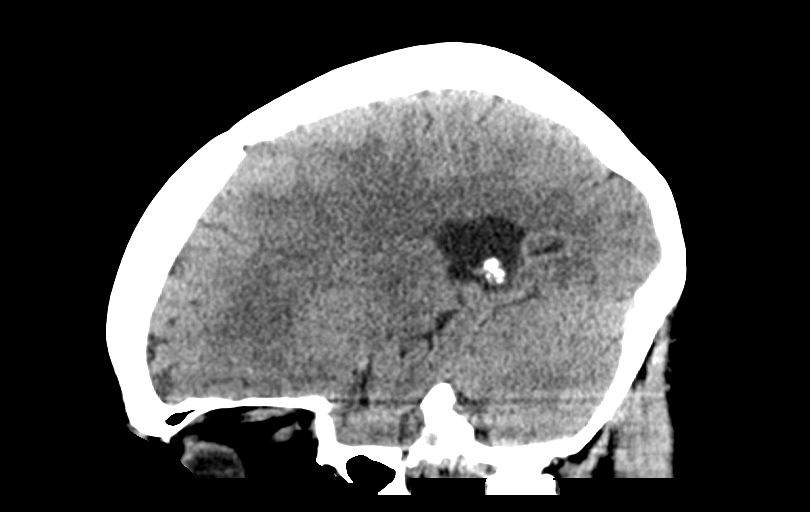

[14 of 47 positions shown; findings below may reference images not displayed]

FINDINGS: Brain: No acute intracranial abnormality. Specifically, no
hemorrhage, hydrocephalus, mass lesion, acute infarction, or
significant intracranial injury.

Vascular: No hyperdense vessel or unexpected calcification.

Skull: No acute calvarial abnormality.

Sinuses/Orbits: No acute findings

Other: None
IMPRESSION: Normal study.

## 2022-11-22 DIAGNOSIS — Z419 Encounter for procedure for purposes other than remedying health state, unspecified: Secondary | ICD-10-CM | POA: Diagnosis not present

## 2022-12-23 DIAGNOSIS — Z419 Encounter for procedure for purposes other than remedying health state, unspecified: Secondary | ICD-10-CM | POA: Diagnosis not present

## 2023-01-22 DIAGNOSIS — Z419 Encounter for procedure for purposes other than remedying health state, unspecified: Secondary | ICD-10-CM | POA: Diagnosis not present

## 2023-02-22 DIAGNOSIS — Z419 Encounter for procedure for purposes other than remedying health state, unspecified: Secondary | ICD-10-CM | POA: Diagnosis not present

## 2023-03-25 DIAGNOSIS — Z419 Encounter for procedure for purposes other than remedying health state, unspecified: Secondary | ICD-10-CM | POA: Diagnosis not present

## 2023-04-24 DIAGNOSIS — Z419 Encounter for procedure for purposes other than remedying health state, unspecified: Secondary | ICD-10-CM | POA: Diagnosis not present

## 2023-05-25 DIAGNOSIS — Z419 Encounter for procedure for purposes other than remedying health state, unspecified: Secondary | ICD-10-CM | POA: Diagnosis not present

## 2023-06-24 DIAGNOSIS — Z419 Encounter for procedure for purposes other than remedying health state, unspecified: Secondary | ICD-10-CM | POA: Diagnosis not present

## 2023-07-25 DIAGNOSIS — Z419 Encounter for procedure for purposes other than remedying health state, unspecified: Secondary | ICD-10-CM | POA: Diagnosis not present

## 2023-08-25 DIAGNOSIS — Z419 Encounter for procedure for purposes other than remedying health state, unspecified: Secondary | ICD-10-CM | POA: Diagnosis not present

## 2024-04-10 ENCOUNTER — Other Ambulatory Visit (HOSPITAL_BASED_OUTPATIENT_CLINIC_OR_DEPARTMENT_OTHER): Payer: Self-pay

## 2024-04-10 MED ORDER — AMPHETAMINE-DEXTROAMPHETAMINE 20 MG PO TABS
20.0000 mg | ORAL_TABLET | Freq: Three times a day (TID) | ORAL | 0 refills | Status: AC
  Filled 2024-04-10: qty 9, 3d supply, fill #0

## 2024-04-12 ENCOUNTER — Other Ambulatory Visit (HOSPITAL_BASED_OUTPATIENT_CLINIC_OR_DEPARTMENT_OTHER): Payer: Self-pay

## 2024-04-12 MED ORDER — AMPHETAMINE-DEXTROAMPHETAMINE 20 MG PO TABS
20.0000 mg | ORAL_TABLET | Freq: Three times a day (TID) | ORAL | 0 refills | Status: AC
  Filled 2024-05-13: qty 90, 30d supply, fill #0

## 2024-04-12 MED ORDER — AMPHETAMINE-DEXTROAMPHETAMINE 20 MG PO TABS
20.0000 mg | ORAL_TABLET | Freq: Three times a day (TID) | ORAL | 0 refills | Status: AC
  Filled 2024-06-12: qty 90, 30d supply, fill #0

## 2024-04-12 MED ORDER — AMPHETAMINE-DEXTROAMPHETAMINE 20 MG PO TABS
20.0000 mg | ORAL_TABLET | Freq: Three times a day (TID) | ORAL | 0 refills | Status: AC
  Filled 2024-04-14: qty 90, 30d supply, fill #0

## 2024-04-14 ENCOUNTER — Other Ambulatory Visit (HOSPITAL_BASED_OUTPATIENT_CLINIC_OR_DEPARTMENT_OTHER): Payer: Self-pay

## 2024-05-13 ENCOUNTER — Other Ambulatory Visit (HOSPITAL_BASED_OUTPATIENT_CLINIC_OR_DEPARTMENT_OTHER): Payer: Self-pay

## 2024-06-12 ENCOUNTER — Other Ambulatory Visit (HOSPITAL_BASED_OUTPATIENT_CLINIC_OR_DEPARTMENT_OTHER): Payer: Self-pay

## 2024-06-12 ENCOUNTER — Other Ambulatory Visit (HOSPITAL_COMMUNITY): Payer: Self-pay

## 2024-07-10 ENCOUNTER — Other Ambulatory Visit (HOSPITAL_BASED_OUTPATIENT_CLINIC_OR_DEPARTMENT_OTHER): Payer: Self-pay

## 2024-07-10 MED ORDER — AMPHETAMINE-DEXTROAMPHETAMINE 20 MG PO TABS
20.0000 mg | ORAL_TABLET | Freq: Three times a day (TID) | ORAL | 0 refills | Status: DC
  Filled 2024-07-12: qty 90, 30d supply, fill #0

## 2024-07-11 ENCOUNTER — Other Ambulatory Visit (HOSPITAL_BASED_OUTPATIENT_CLINIC_OR_DEPARTMENT_OTHER): Payer: Self-pay

## 2024-07-12 ENCOUNTER — Other Ambulatory Visit (HOSPITAL_BASED_OUTPATIENT_CLINIC_OR_DEPARTMENT_OTHER): Payer: Self-pay

## 2024-08-11 ENCOUNTER — Other Ambulatory Visit (HOSPITAL_BASED_OUTPATIENT_CLINIC_OR_DEPARTMENT_OTHER): Payer: Self-pay

## 2024-08-11 MED ORDER — AMPHETAMINE-DEXTROAMPHETAMINE 20 MG PO TABS
20.0000 mg | ORAL_TABLET | Freq: Three times a day (TID) | ORAL | 0 refills | Status: AC
  Filled 2024-08-11: qty 90, 30d supply, fill #0
